# Patient Record
Sex: Male | Born: 1955 | Race: Black or African American | Hispanic: No | Marital: Married | Smoking: Never smoker
Health system: Southern US, Community
[De-identification: ages and names within clinical notes are randomized; demographics above are authoritative.]

## PROBLEM LIST (undated history)

## (undated) DIAGNOSIS — E785 Hyperlipidemia, unspecified: Secondary | ICD-10-CM

## (undated) DIAGNOSIS — I639 Cerebral infarction, unspecified: Secondary | ICD-10-CM

## (undated) DIAGNOSIS — I10 Essential (primary) hypertension: Secondary | ICD-10-CM

---

## 2000-12-11 ENCOUNTER — Emergency Department (HOSPITAL_COMMUNITY): Admission: EM | Admit: 2000-12-11 | Discharge: 2000-12-11 | Payer: Self-pay | Admitting: *Deleted

## 2000-12-11 ENCOUNTER — Encounter: Payer: Self-pay | Admitting: *Deleted

## 2003-03-15 ENCOUNTER — Emergency Department (HOSPITAL_COMMUNITY): Admission: EM | Admit: 2003-03-15 | Discharge: 2003-03-15 | Payer: Self-pay | Admitting: Internal Medicine

## 2005-09-10 ENCOUNTER — Emergency Department (HOSPITAL_COMMUNITY): Admission: EM | Admit: 2005-09-10 | Discharge: 2005-09-10 | Payer: Self-pay | Admitting: Emergency Medicine

## 2008-06-17 ENCOUNTER — Emergency Department (HOSPITAL_COMMUNITY): Admission: EM | Admit: 2008-06-17 | Discharge: 2008-06-17 | Payer: Self-pay | Admitting: Emergency Medicine

## 2008-10-22 ENCOUNTER — Emergency Department (HOSPITAL_COMMUNITY): Admission: EM | Admit: 2008-10-22 | Discharge: 2008-10-22 | Payer: Self-pay | Admitting: Emergency Medicine

## 2008-11-08 ENCOUNTER — Emergency Department (HOSPITAL_COMMUNITY): Admission: EM | Admit: 2008-11-08 | Discharge: 2008-11-08 | Payer: Self-pay | Admitting: Emergency Medicine

## 2010-05-06 ENCOUNTER — Encounter: Payer: Self-pay | Admitting: Internal Medicine

## 2010-07-27 LAB — URINE MICROSCOPIC-ADD ON

## 2010-07-27 LAB — URINALYSIS, ROUTINE W REFLEX MICROSCOPIC
Bilirubin Urine: NEGATIVE
Glucose, UA: NEGATIVE mg/dL
Ketones, ur: NEGATIVE mg/dL
Leukocytes, UA: NEGATIVE
Nitrite: NEGATIVE
Protein, ur: NEGATIVE mg/dL
Specific Gravity, Urine: >1.03 — ABNORMAL HIGH (ref 1.005–1.030)
Urobilinogen, UA: 4 mg/dL — ABNORMAL HIGH (ref 0.0–1.0)
pH: 6 (ref 5.0–8.0)

## 2011-03-23 ENCOUNTER — Emergency Department (HOSPITAL_COMMUNITY)
Admission: EM | Admit: 2011-03-23 | Discharge: 2011-03-23 | Disposition: A | Discharge status: Home / Self Care | Payer: No Typology Code available for payment source | Attending: Emergency Medicine | Admitting: Emergency Medicine

## 2011-03-23 ENCOUNTER — Emergency Department (HOSPITAL_COMMUNITY): Payer: No Typology Code available for payment source

## 2011-03-23 DIAGNOSIS — Z7982 Long term (current) use of aspirin: Secondary | ICD-10-CM | POA: Insufficient documentation

## 2011-03-23 DIAGNOSIS — Y92410 Unspecified street and highway as the place of occurrence of the external cause: Secondary | ICD-10-CM | POA: Insufficient documentation

## 2011-03-23 DIAGNOSIS — S161XXA Strain of muscle, fascia and tendon at neck level, initial encounter: Secondary | ICD-10-CM

## 2011-03-23 DIAGNOSIS — E785 Hyperlipidemia, unspecified: Secondary | ICD-10-CM | POA: Insufficient documentation

## 2011-03-23 DIAGNOSIS — S335XXA Sprain of ligaments of lumbar spine, initial encounter: Secondary | ICD-10-CM | POA: Insufficient documentation

## 2011-03-23 DIAGNOSIS — S39012A Strain of muscle, fascia and tendon of lower back, initial encounter: Secondary | ICD-10-CM

## 2011-03-23 DIAGNOSIS — S139XXA Sprain of joints and ligaments of unspecified parts of neck, initial encounter: Secondary | ICD-10-CM | POA: Insufficient documentation

## 2011-03-23 DIAGNOSIS — Z8673 Personal history of transient ischemic attack (TIA), and cerebral infarction without residual deficits: Secondary | ICD-10-CM | POA: Insufficient documentation

## 2011-03-23 DIAGNOSIS — I10 Essential (primary) hypertension: Secondary | ICD-10-CM | POA: Insufficient documentation

## 2011-03-23 DIAGNOSIS — Z87891 Personal history of nicotine dependence: Secondary | ICD-10-CM | POA: Insufficient documentation

## 2011-03-23 HISTORY — DX: Cerebral infarction, unspecified: I63.9

## 2011-03-23 HISTORY — DX: Hyperlipidemia, unspecified: E78.5

## 2011-03-23 HISTORY — DX: Essential (primary) hypertension: I10

## 2011-03-23 MED ORDER — OXYCODONE-ACETAMINOPHEN 5-325 MG PO TABS
2.0000 | ORAL_TABLET | Freq: Once | ORAL | Status: AC
  Administered 2011-03-23: 2 via ORAL
  Filled 2011-03-23: qty 2

## 2011-03-23 MED ORDER — IBUPROFEN 800 MG PO TABS: 800.0000 mg | ORAL_TABLET | Freq: Three times a day (TID) | ORAL | Status: AC | PRN

## 2011-03-23 MED ORDER — OXYCODONE-ACETAMINOPHEN 5-325 MG PO TABS: 2.0000 | ORAL_TABLET | ORAL | Status: AC | PRN

## 2011-03-23 MED ORDER — DIAZEPAM 5 MG PO TABS: 5.0000 mg | ORAL_TABLET | Freq: Four times a day (QID) | ORAL | Status: AC | PRN

## 2011-03-23 MED ORDER — IBUPROFEN 800 MG PO TABS
800.0000 mg | ORAL_TABLET | Freq: Once | ORAL | Status: AC
  Administered 2011-03-23: 800 mg via ORAL
  Filled 2011-03-23: qty 1

## 2011-03-23 MED ORDER — DIAZEPAM 5 MG PO TABS
5.0000 mg | ORAL_TABLET | Freq: Once | ORAL | Status: AC
  Administered 2011-03-23: 5 mg via ORAL
  Filled 2011-03-23: qty 1
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
## 2011-03-23 NOTE — ED Notes (Signed)
EDP at bedside  
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
## 2014-03-17 ENCOUNTER — Encounter (HOSPITAL_COMMUNITY): Payer: Medicare Other | Attending: Hematology and Oncology

## 2014-03-17 ENCOUNTER — Encounter (HOSPITAL_COMMUNITY): Payer: Self-pay

## 2014-03-17 VITALS — BP 162/84 | HR 93 | Temp 97.9°F | Resp 18 | Wt 141.5 lb

## 2014-03-17 DIAGNOSIS — I10 Essential (primary) hypertension: Secondary | ICD-10-CM | POA: Insufficient documentation

## 2014-03-17 DIAGNOSIS — R209 Unspecified disturbances of skin sensation: Secondary | ICD-10-CM | POA: Diagnosis not present

## 2014-03-17 DIAGNOSIS — Z7982 Long term (current) use of aspirin: Secondary | ICD-10-CM | POA: Insufficient documentation

## 2014-03-17 DIAGNOSIS — E785 Hyperlipidemia, unspecified: Secondary | ICD-10-CM | POA: Insufficient documentation

## 2014-03-17 DIAGNOSIS — I69398 Other sequelae of cerebral infarction: Secondary | ICD-10-CM | POA: Insufficient documentation

## 2014-03-17 DIAGNOSIS — D72829 Elevated white blood cell count, unspecified: Secondary | ICD-10-CM

## 2014-03-17 DIAGNOSIS — I639 Cerebral infarction, unspecified: Secondary | ICD-10-CM

## 2014-03-17 DIAGNOSIS — D75839 Thrombocytosis, unspecified: Secondary | ICD-10-CM | POA: Insufficient documentation

## 2014-03-17 DIAGNOSIS — D473 Essential (hemorrhagic) thrombocythemia: Secondary | ICD-10-CM

## 2014-03-17 DIAGNOSIS — G5600 Carpal tunnel syndrome, unspecified upper limb: Secondary | ICD-10-CM

## 2014-03-17 LAB — COMPREHENSIVE METABOLIC PANEL
ALBUMIN: 3.5 g/dL (ref 3.5–5.2)
ALT: 19 U/L (ref 0–53)
ANION GAP: 11 (ref 5–15)
AST: 21 U/L (ref 0–37)
Alkaline Phosphatase: 63 U/L (ref 39–117)
BUN: 18 mg/dL (ref 6–23)
CALCIUM: 9.2 mg/dL (ref 8.4–10.5)
CO2: 27 mEq/L (ref 19–32)
Chloride: 103 mEq/L (ref 96–112)
Creatinine, Ser: 1.2 mg/dL (ref 0.50–1.35)
GFR calc non Af Amer: 65 mL/min — ABNORMAL LOW (ref >90)
GFR, EST AFRICAN AMERICAN: 75 mL/min — AB (ref >90)
GLUCOSE: 97 mg/dL (ref 70–99)
Potassium: 4 mEq/L (ref 3.7–5.3)
SODIUM: 141 meq/L (ref 137–147)
TOTAL PROTEIN: 7 g/dL (ref 6.0–8.3)
Total Bilirubin: 0.6 mg/dL (ref 0.3–1.2)

## 2014-03-17 LAB — CBC WITH DIFFERENTIAL/PLATELET
BASOS PCT: 1 % (ref 0–1)
Basophils Absolute: 0.1 10*3/uL (ref 0.0–0.1)
EOS ABS: 0.1 10*3/uL (ref 0.0–0.7)
EOS PCT: 2 % (ref 0–5)
HCT: 38.6 % — ABNORMAL LOW (ref 39.0–52.0)
Hemoglobin: 13 g/dL (ref 13.0–17.0)
LYMPHS ABS: 1.5 10*3/uL (ref 0.7–4.0)
Lymphocytes Relative: 28 % (ref 12–46)
MCH: 27 pg (ref 26.0–34.0)
MCHC: 33.7 g/dL (ref 30.0–36.0)
MCV: 80.2 fL (ref 78.0–100.0)
Monocytes Absolute: 0.4 10*3/uL (ref 0.1–1.0)
Monocytes Relative: 8 % (ref 3–12)
NEUTROS PCT: 61 % (ref 43–77)
Neutro Abs: 3.1 10*3/uL (ref 1.7–7.7)
PLATELETS: 314 10*3/uL (ref 150–400)
RBC: 4.81 MIL/uL (ref 4.22–5.81)
RDW: 15.1 % (ref 11.5–15.5)
WBC: 5.1 10*3/uL (ref 4.0–10.5)

## 2014-03-17 LAB — LACTATE DEHYDROGENASE: LDH: 163 U/L (ref 94–250)

## 2014-03-17 LAB — FERRITIN: FERRITIN: 371 ng/mL — AB (ref 22–322)

## 2014-03-17 NOTE — Patient Instructions (Signed)
[facility] Discharge Instructions  RECOMMENDATIONS MADE BY THE CONSULTANT AND ANY TEST RESULTS WILL BE SENT TO YOUR REFERRING PHYSICIAN.  We will see you in 2 weeks for an office visit and repeat CBC. At that time we will review your lab results. Please continue your aspirin.    Thank you for choosing [facility] to provide your oncology and hematology care.  To afford each patient quality time with our providers, please arrive at least 15 minutes before your scheduled appointment time.  With your help, our goal is to use those 15 minutes to complete the necessary work-up to ensure our physicians have the information they need to help with your evaluation and healthcare recommendations.    Effective January 1st, 2014, we ask that you re-schedule your appointment with our physicians should you arrive 10 or more minutes late for your appointment.  We strive to give you quality time with our providers, and arriving late affects you and other patients whose appointments are after yours.    Again, thank you for choosing [facility].  Our hope is that these requests will decrease the amount of time that you wait before being seen by our physicians.       _____________________________________________________________  Should you have questions after your visit to [facility], please contact our office at [phone] between the hours of 8:30 a.m. and 4:30 p.m.  Voicemails left after 4:30 p.m. will not be returned until the following business day.  For prescription refill requests, have your pharmacy contact our office with your prescription refill request.    _______________________________________________________________  We hope that we have given you very good care.  You may receive a patient satisfaction survey in the mail, please complete it and return it as soon as possible.  We value your  feedback!  _______________________________________________________________  Have you asked about our STAR program?  STAR stands for Survivorship Training and Rehabilitation, and this is a nationally recognized cancer care program that focuses on survivorship and rehabilitation.  Cancer and cancer treatments may cause problems, such as, pain, making you feel tired and keeping you from doing the things that you need or want to do. Cancer rehabilitation can help. Our goal is to reduce these troubling effects and help you have the best quality of life possible.  You may receive a survey from a nurse that asks questions about your current state of health.  Based on the survey results, all eligible patients will be referred to the [facility] program for an evaluation so we can better serve you!  A frequently asked questions sheet is available upon request.

## 2014-03-17 NOTE — Progress Notes (Signed)
Jonathan Tapia's reason for visit today are for labs as scheduled per MD orders.  Venipuncture performed with a 23 gauge butterfly needle to R Antecubital.  Jonathan Tapia tolerated venipuncture well and without incident; questions were answered and patient was discharged.

## 2014-03-17 NOTE — Progress Notes (Signed)
[place] A. Barnet Glasgow, M.D.  NEW PATIENT EVALUATION   Name: Jonathan Tapia Date: 03/18/2014 MRN: [id number] DOB: 02/15/1956  PCP: Jacqulyn Bath, MD   REFERRING PHYSICIAN: No ref. provider found  REASON FOR REFERRAL: Thrombocytosis     HISTORY OF PRESENT ILLNESS:Jonathan Tapia is a 58 y.o. male who is referred by Dr. Lance Coon for evaluation of thrombocytosis. 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 He still has some residual paresthesias involving the right upper extremity and right lower extremity. Appetite has been good with no easy satiety, fever but with an episode of night sweats about 2 weeks ago that seemed to be persistent. He denies any cough, wheezing, sore throat, diarrhea, constipation, melena, hematochezia, hematuria, urinary hesitancy, lower extremity swelling or redness, joint pain, skin rash, pruritus, headache, or seizures.   PAST MEDICAL HISTORY:  has a past medical history of Hypertension; Hyperlipidemia; and Stroke.     PAST SURGICAL HISTORY:History reviewed. No pertinent past surgical history.   CURRENT MEDICATIONS: has a current medication list which includes the following prescription(s): aspirin ec, carvedilol, lisinopril-hydrochlorothiazide, pravastatin, and tamsulosin.   ALLERGIES: Review of patient's allergies indicates no known allergies.   SOCIAL HISTORY:  reports that he has never smoked. He does not have any smokeless tobacco history on file. He reports that he drinks alcohol. He reports that he does not use illicit drugs.   FAMILY HISTORY: family history includes Cancer in his mother; Hypertension in his mother and sister.    REVIEW OF SYSTEMS:  Other than that discussed above is noncontributory.    PHYSICAL EXAM:  weight is 141 lb 8 oz (64.184 kg). His oral temperature is 97.9 F  (36.6 C). His blood pressure is 162/84 and his pulse is 93. His respiration is 18 and oxygen saturation is 93%.    GENERAL:alert, no distress and comfortable SKIN: skin color, texture, turgor are normal, no rashes or significant lesions EYES: normal, Conjunctiva are pink and non-injected, sclera clear OROPHARYNX:no exudate, no erythema and lips, buccal mucosa, and tongue normal  NECK: supple, thyroid normal size, non-tender, without nodularity CHEST: Increased AP diameter with no breast masses. LYMPH:  no palpable lymphadenopathy in the cervical, axillary or inguinal LUNGS: clear to auscultation and percussion with normal breathing effort HEART: regular rate & rhythm and no murmurs ABDOMEN:abdomen soft, non-tender and normal bowel sounds MUSCULOSKELETALl:no cyanosis of digits, no clubbing or edema  NEURO: alert & oriented x 3 with fluent speech, no focal motor/sensory deficits    LABORATORY DATA:  Lab Results  Component Value Date   WBC 5.1 03/17/2014   HGB 13.0 03/17/2014   HCT 38.6* 03/17/2014   MCV 80.2 03/17/2014   PLT 314 03/17/2014    02/22/2014:  WBC 12.0, hemoglobin 13.2, platelets 506,000, MCV 84.5.                    Altace 64, BUN 16, creatinine 1.35, TSH 1.71     @RADIOGRAPHY : No results found.  PATHOLOGY: None.   IMPRESSION:  #1. Thrombocytosis. #2. 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 #3. Essential hypertension, controlled. #4. Hyperlipidemia, on treatment. #5. History of carpal tunnel syndrome.   PLAN:  #1. In the presence of his wife and a friend, thrombocytosis was discussed as either a secondary or primary phenomenon. In an effort to determine etiology, additional  lab tests were done today. #2. Continue daily aspirin. #3. Follow-up in 2 weeks with CBC.  I appreciate the opportunity of sharing in his care.   Doroteo Bradford, MD 03/18/2014 7:47 AM   DISCLAIMER:  This note was dictated with voice recognition  softwre.  Similar sounding words can inadvertently be transcribed inaccurately and may not be corrected upon review.

## 2014-03-20 LAB — BCR/ABL GENE REARRANGEMENT QNT, PCR
BCR ABL1 / ABL1 IS: 0 %
BCR ABL1/ABL1: 0 %

## 2014-03-21 LAB — P190 BCR-ABL 1: P190 BCR ABL1: NOT DETECTED

## 2014-03-21 LAB — P210 BCR-ABL 1: P210 BCR ABL1: NOT DETECTED

## 2014-03-23 LAB — JAK2 GENOTYPR

## 2014-03-31 ENCOUNTER — Encounter (HOSPITAL_BASED_OUTPATIENT_CLINIC_OR_DEPARTMENT_OTHER): Payer: Medicare Other

## 2014-03-31 ENCOUNTER — Encounter (HOSPITAL_COMMUNITY): Payer: Self-pay

## 2014-03-31 VITALS — BP 141/75 | HR 70 | Temp 98.6°F | Resp 18 | Wt 142.0 lb

## 2014-03-31 DIAGNOSIS — D473 Essential (hemorrhagic) thrombocythemia: Secondary | ICD-10-CM

## 2014-03-31 DIAGNOSIS — I10 Essential (primary) hypertension: Secondary | ICD-10-CM

## 2014-03-31 DIAGNOSIS — D75839 Thrombocytosis, unspecified: Secondary | ICD-10-CM

## 2014-03-31 DIAGNOSIS — I639 Cerebral infarction, unspecified: Secondary | ICD-10-CM

## 2014-03-31 LAB — CBC WITH DIFFERENTIAL/PLATELET
Basophils Absolute: 0.1 10*3/uL (ref 0.0–0.1)
Basophils Relative: 1 % (ref 0–1)
EOS ABS: 0.2 10*3/uL (ref 0.0–0.7)
EOS PCT: 3 % (ref 0–5)
HCT: 40.1 % (ref 39.0–52.0)
HEMOGLOBIN: 13.1 g/dL (ref 13.0–17.0)
LYMPHS ABS: 2 10*3/uL (ref 0.7–4.0)
Lymphocytes Relative: 28 % (ref 12–46)
MCH: 26.7 pg (ref 26.0–34.0)
MCHC: 32.7 g/dL (ref 30.0–36.0)
MCV: 81.7 fL (ref 78.0–100.0)
MONO ABS: 0.8 10*3/uL (ref 0.1–1.0)
MONOS PCT: 11 % (ref 3–12)
Neutro Abs: 3.9 10*3/uL (ref 1.7–7.7)
Neutrophils Relative %: 57 % (ref 43–77)
Platelets: 300 10*3/uL (ref 150–400)
RBC: 4.91 MIL/uL (ref 4.22–5.81)
RDW: 15.1 % (ref 11.5–15.5)
WBC: 6.9 10*3/uL (ref 4.0–10.5)

## 2014-03-31 NOTE — Patient Instructions (Signed)
..  [facility] Discharge Instructions  RECOMMENDATIONS MADE BY THE CONSULTANT AND ANY TEST RESULTS WILL BE SENT TO YOUR REFERRING PHYSICIAN.  EXAM FINDINGS BY THE PHYSICIAN TODAY AND SIGNS OR SYMPTOMS TO REPORT TO CLINIC OR PRIMARY PHYSICIAN: Exam and findings as discussed by Dr. Barnet Glasgow. You had reactive thrombocytosis INSTRUCTIONS/FOLLOW-UP: No follow up needed  Thank you for choosing [facility] to provide your oncology and hematology care.  To afford each patient quality time with our providers, please arrive at least 15 minutes before your scheduled appointment time.  With your help, our goal is to use those 15 minutes to complete the necessary work-up to ensure our physicians have the information they need to help with your evaluation and healthcare recommendations.    Effective January 1st, 2014, we ask that you re-schedule your appointment with our physicians should you arrive 10 or more minutes late for your appointment.  We strive to give you quality time with our providers, and arriving late affects you and other patients whose appointments are after yours.    Again, thank you for choosing [facility].  Our hope is that these requests will decrease the amount of time that you wait before being seen by our physicians.       _____________________________________________________________  Should you have questions after your visit to [facility], please contact our office at [phone] between the hours of 8:30 a.m. and 4:30 p.m.  Voicemails left after 4:30 p.m. will not be returned until the following business day.  For prescription refill requests, have your pharmacy contact our office with your prescription refill request.    _______________________________________________________________  We hope that we have given you very good care.  You may receive a patient satisfaction survey in the mail, please complete it and  return it as soon as possible.  We value your feedback!  _______________________________________________________________  Have you asked about our STAR program?  STAR stands for Survivorship Training and Rehabilitation, and this is a nationally recognized cancer care program that focuses on survivorship and rehabilitation.  Cancer and cancer treatments may cause problems, such as, pain, making you feel tired and keeping you from doing the things that you need or want to do. Cancer rehabilitation can help. Our goal is to reduce these troubling effects and help you have the best quality of life possible.  You may receive a survey from a nurse that asks questions about your current state of health.  Based on the survey results, all eligible patients will be referred to the [facility] program for an evaluation so we can better serve you!  A frequently asked questions sheet is available upon request.

## 2014-03-31 NOTE — Progress Notes (Signed)
[place], MD [address] [place] [address]  DIAGNOSIS: Thrombocytosis  Stroke  Essential hypertension  Chief Complaint  Patient presents with  . Thrombocytosis    CURRENT THERAPY: Workup completed for thrombocytosis with original platelet count of 506,000 on 02/22/2014 from an outside lab.  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 On his first visit to this clinic on 03/17/2014 platelet count had dropped to 314,000. He has not developed any worsening right-sided symptoms. Appetite is good with no headache, fever, night sweats, easy satiety, lower extremity swelling or redness, cough, wheezing, diarrhea, constipation, dysuria, hematuria, urinary hesitancy, skin rash, joint pain, headache, or seizures.  MEDICAL HISTORY: Past Medical History  Diagnosis Date  . Hypertension   . Hyperlipidemia   . Stroke     INTERIM HISTORY: has Thrombocytosis; Stroke; and Hypertension on his problem list.    ALLERGIES:  has No Known Allergies.  MEDICATIONS: has a current medication list which includes the following prescription(s): aspirin ec, carvedilol, lisinopril-hydrochlorothiazide, pravastatin, and tamsulosin.  SURGICAL HISTORY: History reviewed. No pertinent past surgical history.  FAMILY HISTORY: family history includes Cancer in his mother; Hypertension in his mother and sister.  SOCIAL HISTORY:  reports that he has never smoked. He does not have any smokeless tobacco history on file. He reports that he drinks alcohol. He reports that he does not use illicit drugs.  REVIEW OF SYSTEMS:  Other than that discussed above is noncontributory.  PHYSICAL EXAMINATION: ECOG PERFORMANCE STATUS: 1 - Symptomatic but completely ambulatory  Blood pressure 141/75, pulse 70, temperature 98.6 F (37 C), temperature source Oral,  resp. rate 18, weight 142 lb (64.411 kg), SpO2 100 %.  GENERAL:alert, no distress and comfortable SKIN: skin color, texture, turgor are normal, no rashes or significant lesions EYES: PERLA; Conjunctiva are pink and non-injected, sclera clear SINUSES: No redness or tenderness over maxillary or ethmoid sinuses OROPHARYNX:no exudate, no erythema on lips, buccal mucosa, or tongue. NECK: supple, thyroid normal size, non-tender, without nodularity. No masses CHEST: Increased AP diameter with no gynecomastia. LYMPH:  no palpable lymphadenopathy in the cervical, axillary or inguinal LUNGS: clear to auscultation and percussion with normal breathing effort HEART: regular rate & rhythm and no murmurs. ABDOMEN:abdomen soft, non-tender and normal bowel sounds. Liver and spleen not palpable. MUSCULOSKELETAL:no cyanosis of digits and no clubbing. Range of motion normal.  NEURO: alert & oriented x 3 with fluent speech, no focal motor/sensory deficits   LABORATORY DATA: Lab on 03/31/2014  Component Date Value Ref Range Status  . WBC 03/31/2014 6.9  4.0 - 10.5 K/uL Final  . RBC 03/31/2014 4.91  4.22 - 5.81 MIL/uL Final  . Hemoglobin 03/31/2014 13.1  13.0 - 17.0 g/dL Final  . HCT 03/31/2014 40.1  39.0 - 52.0 % Final  . MCV 03/31/2014 81.7  78.0 - 100.0 fL Final  . MCH 03/31/2014 26.7  26.0 - 34.0 pg Final  . MCHC 03/31/2014 32.7  30.0 - 36.0 g/dL Final  . RDW 03/31/2014 15.1  11.5 - 15.5 % Final  . Platelets 03/31/2014 300  150 - 400 K/uL Final  . Neutrophils Relative % 03/31/2014 57  43 - 77 % Final  . Neutro Abs 03/31/2014 3.9  1.7 - 7.7 K/uL Final  . Lymphocytes Relative 03/31/2014 28  12 - 46 % Final  . Lymphs Abs 03/31/2014 2.0  0.7 -  4.0 K/uL Final  . Monocytes Relative 03/31/2014 11  3 - 12 % Final  . Monocytes Absolute 03/31/2014 0.8  0.1 - 1.0 K/uL Final  . Eosinophils Relative 03/31/2014 3  0 - 5 % Final  . Eosinophils Absolute 03/31/2014 0.2  0.0 - 0.7 K/uL Final  . Basophils Relative  03/31/2014 1  0 - 1 % Final  . Basophils Absolute 03/31/2014 0.1  0.0 - 0.1 K/uL Final  Office Visit on 03/17/2014  Component Date Value Ref Range Status  . WBC 03/17/2014 5.1  4.0 - 10.5 K/uL Final  . RBC 03/17/2014 4.81  4.22 - 5.81 MIL/uL Final  . Hemoglobin 03/17/2014 13.0  13.0 - 17.0 g/dL Final  . HCT 03/17/2014 38.6* 39.0 - 52.0 % Final  . MCV 03/17/2014 80.2  78.0 - 100.0 fL Final  . MCH 03/17/2014 27.0  26.0 - 34.0 pg Final  . MCHC 03/17/2014 33.7  30.0 - 36.0 g/dL Final  . RDW 03/17/2014 15.1  11.5 - 15.5 % Final  . Platelets 03/17/2014 314  150 - 400 K/uL Final  . Neutrophils Relative % 03/17/2014 61  43 - 77 % Final  . Neutro Abs 03/17/2014 3.1  1.7 - 7.7 K/uL Final  . Lymphocytes Relative 03/17/2014 28  12 - 46 % Final  . Lymphs Abs 03/17/2014 1.5  0.7 - 4.0 K/uL Final  . Monocytes Relative 03/17/2014 8  3 - 12 % Final  . Monocytes Absolute 03/17/2014 0.4  0.1 - 1.0 K/uL Final  . Eosinophils Relative 03/17/2014 2  0 - 5 % Final  . Eosinophils Absolute 03/17/2014 0.1  0.0 - 0.7 K/uL Final  . Basophils Relative 03/17/2014 1  0 - 1 % Final  . Basophils Absolute 03/17/2014 0.1  0.0 - 0.1 K/uL Final  . Sodium 03/17/2014 141  137 - 147 mEq/L Final  . Potassium 03/17/2014 4.0  3.7 - 5.3 mEq/L Final  . Chloride 03/17/2014 103  96 - 112 mEq/L Final  . CO2 03/17/2014 27  19 - 32 mEq/L Final  . Glucose, Bld 03/17/2014 97  70 - 99 mg/dL Final  . BUN 03/17/2014 18  6 - 23 mg/dL Final  . Creatinine, Ser 03/17/2014 1.20  0.50 - 1.35 mg/dL Final  . Calcium 03/17/2014 9.2  8.4 - 10.5 mg/dL Final  . Total Protein 03/17/2014 7.0  6.0 - 8.3 g/dL Final  . Albumin 03/17/2014 3.5  3.5 - 5.2 g/dL Final  . AST 03/17/2014 21  0 - 37 U/L Final  . ALT 03/17/2014 19  0 - 53 U/L Final  . Alkaline Phosphatase 03/17/2014 63  39 - 117 U/L Final  . Total Bilirubin 03/17/2014 0.6  0.3 - 1.2 mg/dL Final  . GFR calc non Af Amer 03/17/2014 65* >90 mL/min Final  . GFR calc Af Amer 03/17/2014 75* >90  mL/min Final   Comment: (NOTE) The eGFR has been calculated using the CKD EPI equation. This calculation has not been validated in all clinical situations. eGFR's persistently <90 mL/min signify possible Chronic Kidney Disease.   . Anion gap 03/17/2014 11  5 - 15 Final  . LDH 03/17/2014 163  94 - 250 U/L Final  . Ferritin 03/17/2014 371* 22 - 322 ng/mL Final   Performed at [facility]  . BCR ABL1 / ABL1 03/17/2014 0.000   Final  . BCR ABL1 / ABL1 IS 03/17/2014 0.000   Final  . Interpretation - BCRQ 03/17/2014 REPORT   Final   Comment: (NOTE)  The P190 and P210 BCR-ABL1 fusion transcripts are NOT detected. Reverse transcription real-time PCR is performed for the P190 and P210 BCR-ABL1 transcripts associated with the t(9;22) chromosomal translocation. For P190, results are expressed as a percent ratio of BCR-ABL1 to the ABL1 transcript, and further adjusted to the international scale (IS) for P210. Assay sensitivity is dependent on RNA quality and sample cellularity but is usually at least 4-logs below BCR-ABL1 baseline transcript levels. Reference range is 0.000% BCR-ABL1/ABL1. This test was developed and its analytical performance characteristics have been determined by [employer] [facility], [place], [place]. It has not been cleared or approved by the FDA. This assay has been validated pursuant to the CLIA regulations and is used for clinical purposes.                             Yvetta Coder, M.D., Ph.D Medical Director, Molecular Oncology Performed at [facility]   . JAK2 GenotypR 03/17/2014 SEE SEPARATE REPORT   Final  . P190 BCR ABL1 03/17/2014 Not Detected   Final   Performed at [facility]  . P210 BCR ABL1 03/17/2014 Not Detected   Final   Performed at [facility]:  JAK-2 mutation and BCR-ABL both negative.  Urinalysis    Component Value Date/Time   COLORURINE YELLOW 06/17/2008 0154   APPEARANCEUR CLEAR  06/17/2008 0154   LABSPEC >1.030* 06/17/2008 0154   PHURINE 6.0 06/17/2008 0154   GLUCOSEU NEGATIVE 06/17/2008 0154   HGBUR TRACE* 06/17/2008 0154   BILIRUBINUR NEGATIVE 06/17/2008 0154   KETONESUR NEGATIVE 06/17/2008 0154   PROTEINUR NEGATIVE 06/17/2008 0154   UROBILINOGEN 4.0* 06/17/2008 0154   NITRITE NEGATIVE 06/17/2008 0154   LEUKOCYTESUR NEGATIVE 06/17/2008 0154    RADIOGRAPHIC STUDIES: No results found.  ASSESSMENT:  #1. Reactive thrombocytosis, resolved. #2. 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 #3. Essential hypertension, controlled. #4. Hyperlipidemia, on treatment. #5. History of carpal tunnel syndrome.   PLAN:  #1. Continue daily aspirin as stroke prophylaxis. #2. No further appointments in this clinic.   All questions were answered. The patient knows to call the clinic with any problems, questions or concerns. We can certainly see the patient much sooner if necessary.   I spent 25 minutes counseling the patient face to face. The total time spent in the appointment was 30 minutes.    Doroteo Bradford, MD 03/31/2014 7:57 PM  DISCLAIMER:  This note was dictated with voice recognition software.  Similar sounding words can inadvertently be transcribed inaccurately and may not be corrected upon review.

## 2014-03-31 NOTE — Progress Notes (Signed)
Jonathan Tapia presented for labwork. Labs per MD order drawn via Peripheral Line 23 gauge needle inserted in right upper forearm  Good blood return present. Procedure without incident.  Needle removed intact. Patient tolerated procedure well.

## 2015-08-02 ENCOUNTER — Emergency Department (HOSPITAL_COMMUNITY)
Admission: EM | Admit: 2015-08-02 | Discharge: 2015-08-02 | Disposition: A | Discharge status: Home / Self Care | Payer: No Typology Code available for payment source | Attending: Emergency Medicine | Admitting: Emergency Medicine

## 2015-08-02 ENCOUNTER — Encounter (HOSPITAL_COMMUNITY): Payer: Self-pay | Admitting: *Deleted

## 2015-08-02 DIAGNOSIS — I10 Essential (primary) hypertension: Secondary | ICD-10-CM | POA: Diagnosis not present

## 2015-08-02 DIAGNOSIS — Z7982 Long term (current) use of aspirin: Secondary | ICD-10-CM | POA: Diagnosis not present

## 2015-08-02 DIAGNOSIS — Z79899 Other long term (current) drug therapy: Secondary | ICD-10-CM | POA: Insufficient documentation

## 2015-08-02 DIAGNOSIS — S39012A Strain of muscle, fascia and tendon of lower back, initial encounter: Secondary | ICD-10-CM | POA: Insufficient documentation

## 2015-08-02 DIAGNOSIS — E785 Hyperlipidemia, unspecified: Secondary | ICD-10-CM | POA: Diagnosis not present

## 2015-08-02 DIAGNOSIS — Y939 Activity, unspecified: Secondary | ICD-10-CM | POA: Diagnosis not present

## 2015-08-02 DIAGNOSIS — Y92410 Unspecified street and highway as the place of occurrence of the external cause: Secondary | ICD-10-CM | POA: Diagnosis not present

## 2015-08-02 DIAGNOSIS — Y999 Unspecified external cause status: Secondary | ICD-10-CM | POA: Diagnosis not present

## 2015-08-02 DIAGNOSIS — M25551 Pain in right hip: Secondary | ICD-10-CM | POA: Diagnosis present

## 2015-08-02 DIAGNOSIS — I639 Cerebral infarction, unspecified: Secondary | ICD-10-CM | POA: Insufficient documentation

## 2015-08-02 MED ORDER — CYCLOBENZAPRINE HCL 10 MG PO TABS: 10.0000 mg | ORAL_TABLET | Freq: Two times a day (BID) | ORAL | Status: DC | PRN

## 2015-08-02 MED ORDER — NAPROXEN 375 MG PO TABS: 375.0000 mg | ORAL_TABLET | Freq: Two times a day (BID) | ORAL | Status: DC

## 2015-08-02 NOTE — Discharge Instructions (Signed)
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 these conditions: -asthma -cigarette smoker -drink more than 3 alcohol containing drinks a day -heart disease or circulation problems such as heart failure or leg edema (fluid retention) -high blood pressure -kidney disease -liver disease -stomach bleeding or ulcers -an unusual or allergic reaction to naproxen, aspirin, other NSAIDs, other medicines, foods, dyes, or preservatives -pregnant or trying to get pregnant -breast-feeding How should I use this medicine? Take this medicine by mouth with a glass of water. Follow the directions on the prescription label. Take it with food  if your stomach gets upset. Try to not lie down for at least 10 minutes after you take it. Take your medicine at regular intervals. Do not take your medicine more often than directed. Long-term, continuous use may increase the risk of heart attack or stroke. A special MedGuide will be given to you by the pharmacist with each prescription and refill. Be sure to read this information carefully each time. Talk to your  pediatrician regarding the use of this medicine in children. Special care may be needed. Overdosage: If you think you have taken too much of this medicine contact a poison control center or emergency room at once. NOTE: This medicine is only for you. Do not share this medicine with others. What if I miss a dose? If you miss a dose, take it as soon as you can. If it is almost time for your next dose, take only that dose. Do not take double or extra doses. What may interact with this medicine? -alcohol -aspirin -cidofovir -diuretics -lithium -methotrexate -other drugs for inflammation like ketorolac or prednisone -pemetrexed -probenecid -warfarin This list may not describe all possible interactions. Give your health care provider a list of all the medicines, herbs, non-prescription drugs, or dietary supplements you use. Also tell them if you smoke, drink alcohol, or use illegal drugs. Some items may interact with your medicine. What should I watch for while using this medicine? Tell your doctor or health care professional if your pain does not get better. Talk to your doctor before taking another medicine for pain. Do not treat yourself. This medicine does not prevent heart attack or stroke. In fact, this medicine may increase the chance of a heart attack or stroke. The chance may increase with longer use of this medicine and in people who have heart disease. If you take aspirin to prevent heart attack or stroke, talk with your doctor or health care professional. Do not take other medicines that contain aspirin, ibuprofen, or naproxen with this medicine. Side effects such as stomach upset, nausea, or ulcers may be more likely to occur. Many medicines available without a prescription should not be taken with this medicine. This medicine can cause ulcers and bleeding in the stomach and intestines at any time during treatment. Do not smoke cigarettes or drink alcohol. These increase irritation to  your stomach and can make it more susceptible to damage from this medicine. Ulcers and bleeding can happen without warning symptoms and can cause death. You may get drowsy or dizzy. Do not drive, use machinery, or do anything that needs mental alertness until you know how this medicine affects you. Do not stand or sit up quickly, especially if you are an older patient. This reduces the risk of dizzy or fainting spells. This medicine can cause you to bleed more easily. Try to avoid damage to your teeth and gums when you brush or floss your teeth. What side effects may I notice from receiving this medicine? Side effects that you should report to your doctor or health care professional as soon as possible: -black or bloody stools, blood in the urine or vomit -blurred vision -chest pain -difficulty breathing or wheezing -nausea or vomiting -severe stomach pain -skin rash, skin redness, blistering or peeling skin, hives, or itching -slurred speech or weakness on one side of the body -swelling of eyelids, throat, lips -unexplained weight gain or swelling -unusually weak or tired -yellowing of eyes or skin Side effects that usually do not require medical attention (report to your  doctor or health care professional if they continue or are bothersome): -constipation -headache -heartburn This list may not describe all possible side effects. Call your doctor for medical advice about side effects. You may report side effects to FDA at 1-800-FDA-1088. Where should I keep my medicine? Keep out of the reach of children. Store at room temperature between 15 and 30 degrees C (59 and 86 degrees F). Keep container tightly closed. Throw away any unused medicine after the expiration date. NOTE: This sheet is a summary. It may not cover all possible information. If you have questions about this medicine, talk to your doctor, pharmacist, or health care provider.    2016, Elsevier/Gold Standard. (2009-04-04  20:10:16) Cyclobenzaprine tablets What is this medicine? CYCLOBENZAPRINE (sye kloe BEN za preen) is a muscle relaxer. It is used to treat muscle pain, spasms, and stiffness. This medicine may be used for other purposes; ask your health care provider or pharmacist if you have questions. What should I tell my health care provider before I take this medicine? They need to know if you have any of these conditions: -heart disease, irregular heartbeat, or previous heart attack -liver disease -thyroid problem -an unusual or allergic reaction to cyclobenzaprine, tricyclic antidepressants, lactose, other medicines, foods, dyes, or preservatives -pregnant or trying to get pregnant -breast-feeding How should I use this medicine? Take this medicine by mouth with a glass of water. Follow the directions on the prescription label. If this medicine upsets your stomach, take it with food or milk. Take your medicine at regular intervals. Do not take it more often than directed. Talk to your pediatrician regarding the use of this medicine in children. Special care may be needed. Overdosage: If you think you have taken too much of this medicine contact a poison control center or emergency room at once. NOTE: This medicine is only for you. Do not share this medicine with others. What if I miss a dose? If you miss a dose, take it as soon as you can. If it is almost time for your next dose, take only that dose. Do not take double or extra doses. What may interact with this medicine? Do not take this medicine with any of the following medications: -certain medicines for fungal infections like fluconazole, itraconazole, ketoconazole, posaconazole, voriconazole -cisapride -dofetilide -dronedarone -droperidol -flecainide -grepafloxacin -halofantrine -levomethadyl -MAOIs like Carbex, Eldepryl, Marplan, Nardil, and Parnate -nilotinib -pimozide -probucol -sertindole -thioridazine -ziprasidone This medicine  may also interact with the following medications: -abarelix -alcohol -certain medicines for cancer -certain medicines for depression, anxiety, or psychotic disturbances -certain medicines for infection like alfuzosin, chloroquine, clarithromycin, levofloxacin, mefloquine, pentamidine, troleandomycin -certain medicines for an irregular heart beat -certain medicines used for sleep or numbness during surgery or procedure -contrast dyes -dolasetron -guanethidine -methadone -octreotide -ondansetron -other medicines that prolong the QT interval (cause an abnormal heart rhythm) -palonosetron -phenothiazines like chlorpromazine, mesoridazine, prochlorperazine, thioridazine -tramadol -vardenafil This list may not describe all possible interactions. Give your health care provider a list of all the medicines, herbs, non-prescription drugs, or dietary supplements you use. Also tell them if you smoke, drink alcohol, or use illegal drugs. Some items may interact with your medicine. What should I watch for while using this medicine? Check with your doctor or health care professional if your condition does not improve within 1 to 3 weeks. You may get drowsy or dizzy when you first start taking the medicine or change doses. Do not drive, use machinery, or do anything that may be dangerous until you  know how the medicine affects you. Stand or sit up slowly. Your mouth may get dry. Drinking water, chewing sugarless gum, or sucking on hard candy may help. What side effects may I notice from receiving this medicine? Side effects that you should report to your doctor or health care professional as soon as possible: -allergic reactions like skin rash, itching or hives, swelling of the face, lips, or tongue -chest pain -fast heartbeat -hallucinations -seizures -vomiting Side effects that usually do not require medical attention (report to your doctor or health care professional if they continue or are  bothersome): -headache This list may not describe all possible side effects. Call your doctor for medical advice about side effects. You may report side effects to FDA at 1-800-FDA-1088. Where should I keep my medicine? Keep out of the reach of children. Store at room temperature between 15 and 30 degrees C (59 and 86 degrees F). Keep container tightly closed. Throw away any unused medicine after the expiration date. NOTE: This sheet is a summary. It may not cover all possible information. If you have questions about this medicine, talk to your doctor, pharmacist, or health care provider.    2016, Elsevier/Gold Standard. (2012-10-28 12:48:19)
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
## 2018-06-28 ENCOUNTER — Emergency Department (HOSPITAL_COMMUNITY): Payer: Medicare HMO

## 2018-06-28 ENCOUNTER — Encounter (HOSPITAL_COMMUNITY): Payer: Self-pay | Admitting: Emergency Medicine

## 2018-06-28 ENCOUNTER — Other Ambulatory Visit: Payer: Self-pay

## 2018-06-28 ENCOUNTER — Emergency Department (HOSPITAL_COMMUNITY)
Admission: EM | Admit: 2018-06-28 | Discharge: 2018-06-28 | Disposition: A | Discharge status: Home / Self Care | Payer: Medicare HMO | Attending: Emergency Medicine | Admitting: Emergency Medicine

## 2018-06-28 DIAGNOSIS — Y999 Unspecified external cause status: Secondary | ICD-10-CM | POA: Insufficient documentation

## 2018-06-28 DIAGNOSIS — Y929 Unspecified place or not applicable: Secondary | ICD-10-CM | POA: Diagnosis not present

## 2018-06-28 DIAGNOSIS — I10 Essential (primary) hypertension: Secondary | ICD-10-CM | POA: Diagnosis not present

## 2018-06-28 DIAGNOSIS — S838X2A Sprain of other specified parts of left knee, initial encounter: Secondary | ICD-10-CM

## 2018-06-28 DIAGNOSIS — W1842XA Slipping, tripping and stumbling without falling due to stepping into hole or opening, initial encounter: Secondary | ICD-10-CM | POA: Diagnosis not present

## 2018-06-28 DIAGNOSIS — Z7982 Long term (current) use of aspirin: Secondary | ICD-10-CM | POA: Insufficient documentation

## 2018-06-28 DIAGNOSIS — S838X9A Sprain of other specified parts of unspecified knee, initial encounter: Secondary | ICD-10-CM | POA: Diagnosis not present

## 2018-06-28 DIAGNOSIS — Z79899 Other long term (current) drug therapy: Secondary | ICD-10-CM | POA: Insufficient documentation

## 2018-06-28 DIAGNOSIS — S8992XA Unspecified injury of left lower leg, initial encounter: Secondary | ICD-10-CM | POA: Diagnosis present

## 2018-06-28 DIAGNOSIS — Y9301 Activity, walking, marching and hiking: Secondary | ICD-10-CM | POA: Insufficient documentation

## 2018-06-28 MED ORDER — DICLOFENAC SODIUM 50 MG PO TBEC: 50.0000 mg | DELAYED_RELEASE_TABLET | Freq: Two times a day (BID) | ORAL | 0 refills | Status: DC
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
## 2018-07-04 ENCOUNTER — Encounter: Payer: Self-pay | Admitting: Orthopedic Surgery

## 2018-07-04 ENCOUNTER — Ambulatory Visit: Payer: Self-pay | Admitting: Orthopedic Surgery

## 2020-10-16 IMAGING — DX LEFT KNEE - COMPLETE 4+ VIEW
4 series · 4 of 4 positions shown · non-contrast
Comparison: None.

CLINICAL DATA: Patient status post fall.  Knee pain.

EXAM:
LEFT KNEE - COMPLETE 4+ VIEW

[knee ap (1 of 3)]
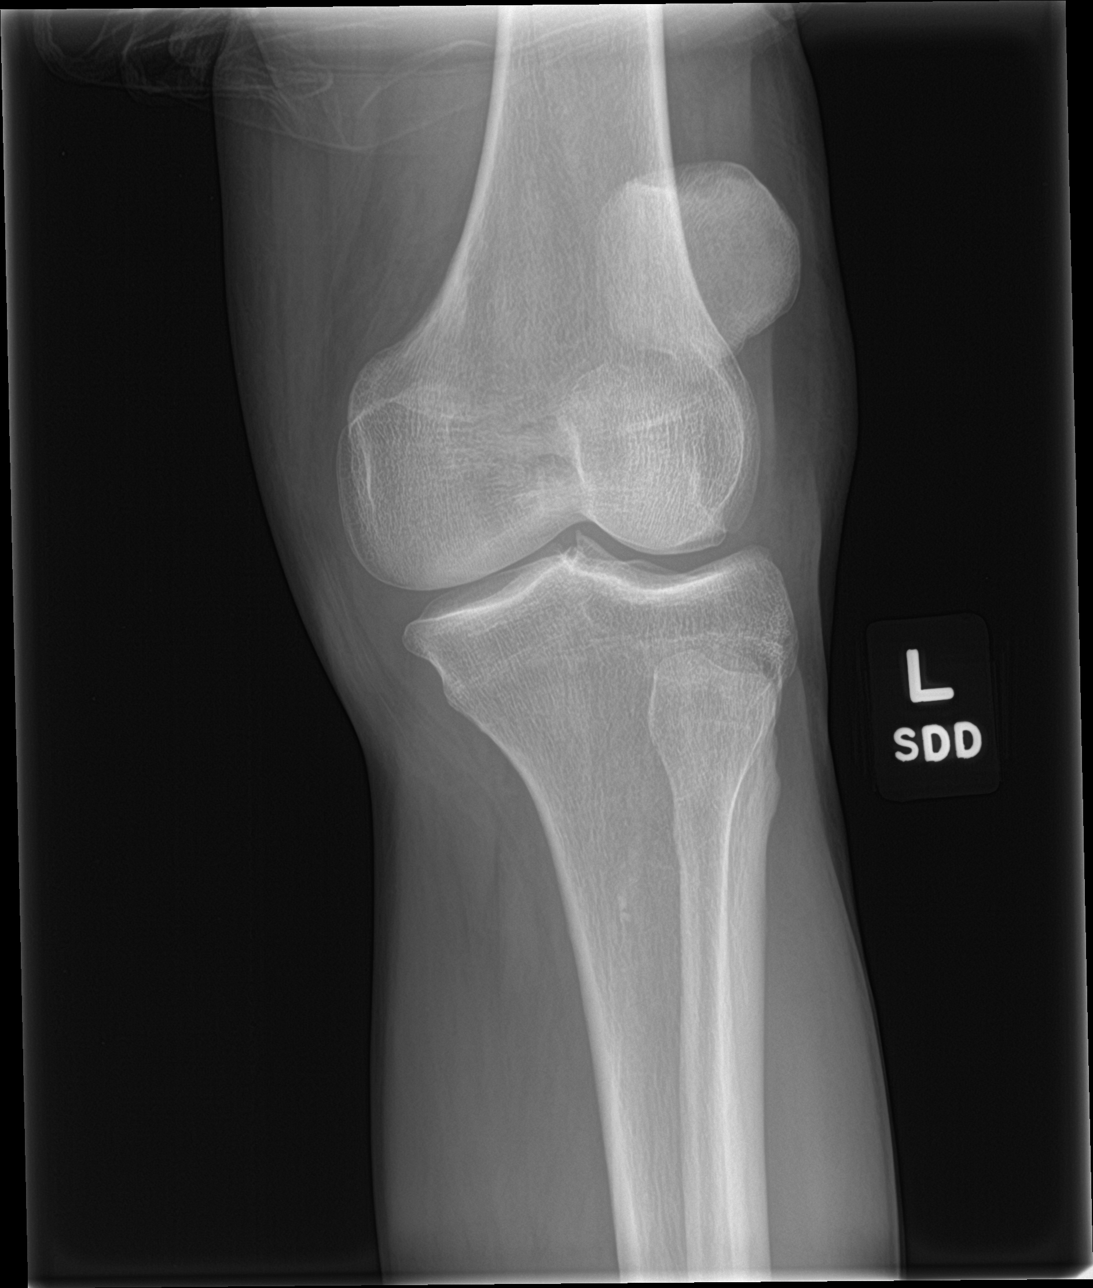

[knee ap (2 of 3)]
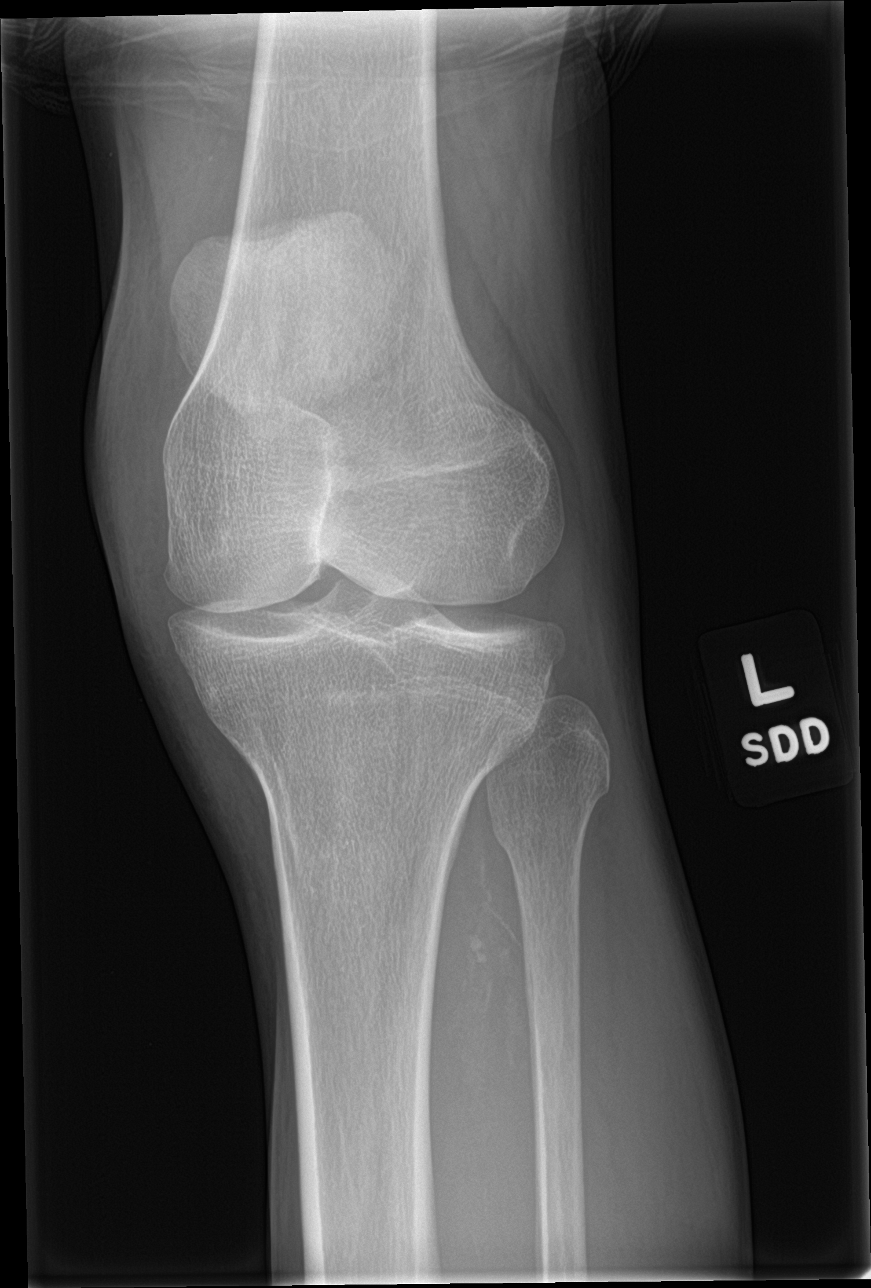

[knee ap (3 of 3)]
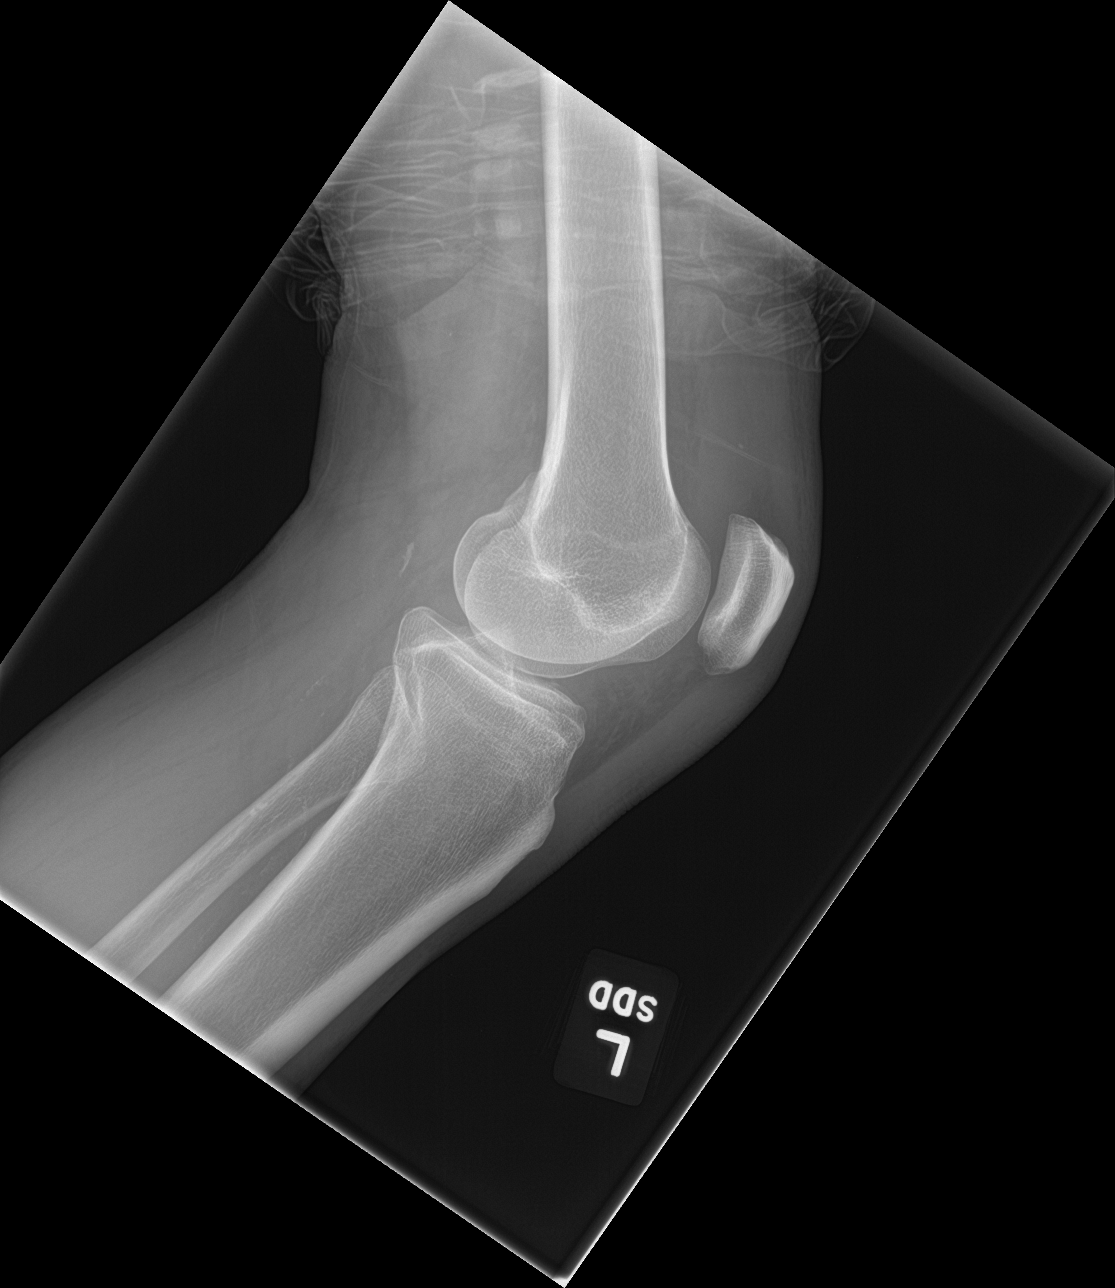

[knee lat]
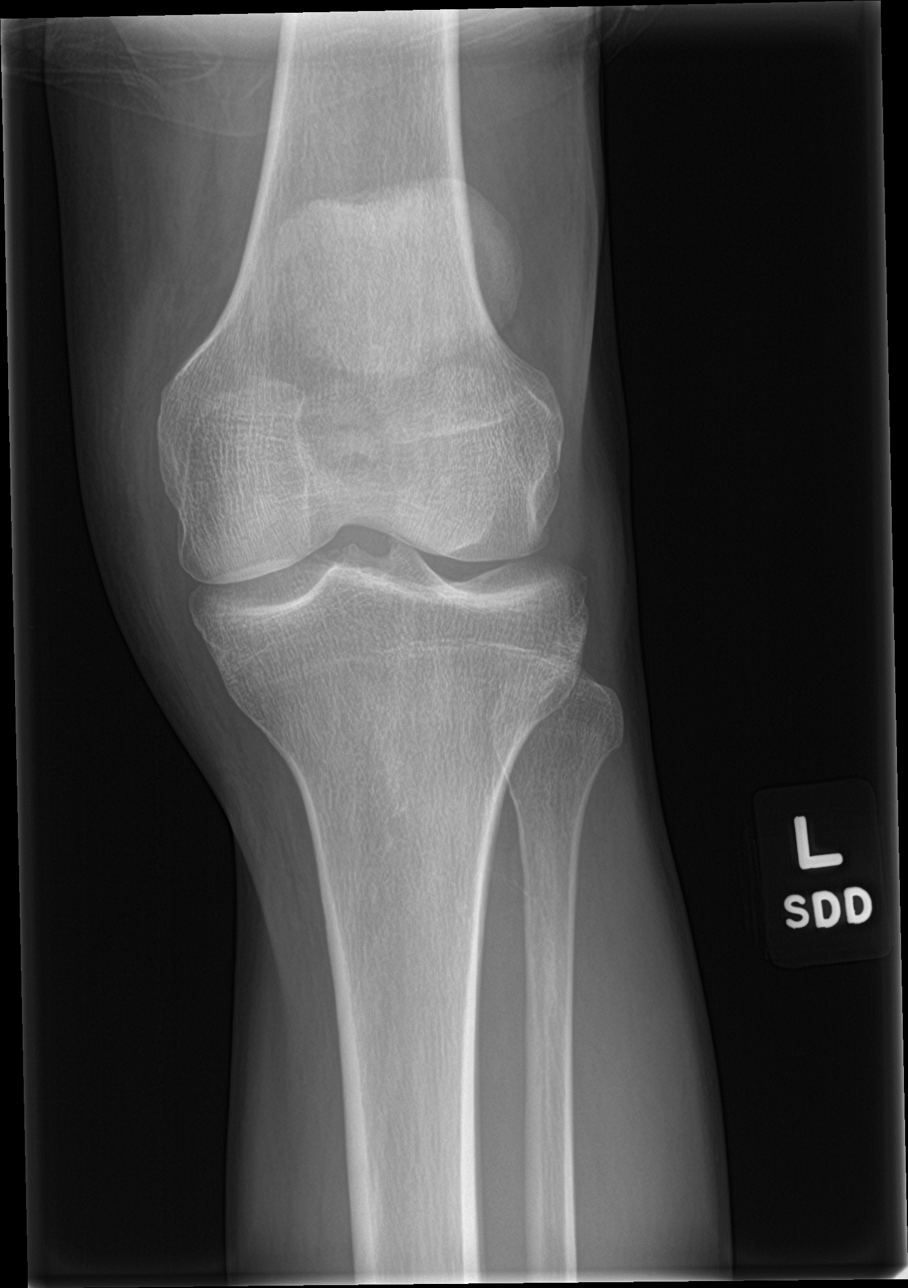

[4 of 4 positions shown; findings below may reference images not displayed]

FINDINGS: Normal anatomic alignment. No evidence for acute fracture or
dislocation. Regional soft tissues are unremarkable.
IMPRESSION: No acute osseous abnormality.

## 2023-06-17 ENCOUNTER — Encounter (HOSPITAL_COMMUNITY): Payer: Self-pay

## 2023-06-17 ENCOUNTER — Emergency Department (HOSPITAL_COMMUNITY)
Admission: EM | Admit: 2023-06-17 | Discharge: 2023-06-17 | Disposition: A | Discharge status: Home / Self Care | Attending: Emergency Medicine | Admitting: Emergency Medicine

## 2023-06-17 ENCOUNTER — Other Ambulatory Visit: Payer: Self-pay

## 2023-06-17 DIAGNOSIS — Z7982 Long term (current) use of aspirin: Secondary | ICD-10-CM | POA: Insufficient documentation

## 2023-06-17 DIAGNOSIS — Z79899 Other long term (current) drug therapy: Secondary | ICD-10-CM | POA: Diagnosis not present

## 2023-06-17 DIAGNOSIS — I10 Essential (primary) hypertension: Secondary | ICD-10-CM | POA: Diagnosis present

## 2023-06-17 DIAGNOSIS — I1A0 Resistant hypertension: Secondary | ICD-10-CM | POA: Diagnosis not present

## 2023-06-17 LAB — BASIC METABOLIC PANEL
Anion gap: 10 (ref 5–15)
BUN: 19 mg/dL (ref 8–23)
CO2: 28 mmol/L (ref 22–32)
Calcium: 9.7 mg/dL (ref 8.9–10.3)
Chloride: 100 mmol/L (ref 98–111)
Creatinine, Ser: 1.13 mg/dL (ref 0.61–1.24)
GFR, Estimated: >60 mL/min (ref >60)
Glucose, Bld: 104 mg/dL — ABNORMAL HIGH (ref 70–99)
Potassium: 3.9 mmol/L (ref 3.5–5.1)
Sodium: 138 mmol/L (ref 135–145)

## 2023-06-17 LAB — CBC WITH DIFFERENTIAL/PLATELET
Abs Immature Granulocytes: 0.01 10*3/uL (ref 0.00–0.07)
Basophils Absolute: 0.1 10*3/uL (ref 0.0–0.1)
Basophils Relative: 2 %
Eosinophils Absolute: 0.1 10*3/uL (ref 0.0–0.5)
Eosinophils Relative: 1 %
HCT: 47.5 % (ref 39.0–52.0)
Hemoglobin: 15.2 g/dL (ref 13.0–17.0)
Immature Granulocytes: 0 %
Lymphocytes Relative: 23 %
Lymphs Abs: 1.3 10*3/uL (ref 0.7–4.0)
MCH: 26.1 pg (ref 26.0–34.0)
MCHC: 32 g/dL (ref 30.0–36.0)
MCV: 81.6 fL (ref 80.0–100.0)
Monocytes Absolute: 0.6 10*3/uL (ref 0.1–1.0)
Monocytes Relative: 10 %
Neutro Abs: 3.6 10*3/uL (ref 1.7–7.7)
Neutrophils Relative %: 64 %
Platelets: 292 10*3/uL (ref 150–400)
RBC: 5.82 MIL/uL — ABNORMAL HIGH (ref 4.22–5.81)
RDW: 15.1 % (ref 11.5–15.5)
WBC: 5.6 10*3/uL (ref 4.0–10.5)
nRBC: 0 % (ref 0.0–0.2)

## 2023-06-17 LAB — URINALYSIS, ROUTINE W REFLEX MICROSCOPIC
Bilirubin Urine: NEGATIVE
Glucose, UA: NEGATIVE mg/dL
Ketones, ur: NEGATIVE mg/dL
Leukocytes,Ua: NEGATIVE
Nitrite: NEGATIVE
Protein, ur: NEGATIVE mg/dL
Specific Gravity, Urine: 1.004 — ABNORMAL LOW (ref 1.005–1.030)
pH: 8 (ref 5.0–8.0)

## 2023-06-17 MED ORDER — LISINOPRIL 30 MG PO TABS: 30.0000 mg | ORAL_TABLET | Freq: Every day | ORAL | 3 refills | Status: AC

## 2023-06-17 MED ORDER — HYDROCHLOROTHIAZIDE 25 MG PO TABS: 25.0000 mg | ORAL_TABLET | Freq: Every day | ORAL | 3 refills | Status: AC

## 2023-06-17 MED ORDER — HYDRALAZINE HCL 20 MG/ML IJ SOLN
10.0000 mg | Freq: Once | INTRAMUSCULAR | Status: AC
  Administered 2023-06-17: 10 mg via INTRAVENOUS
  Filled 2023-06-17: qty 1

## 2023-06-17 NOTE — ED Provider Notes (Signed)
 [facility] AT [facility] Provider Note   CSN: [id number] Arrival date & time: 06/17/23  1103     History  No chief complaint on file.   Jonathan Tapia is a 68 y.o. male.  He is brought in by ambulance from his doctor's office for elevated blood pressure.  He said he has been on blood pressure medication for about 10 years since a prior stroke.  He said his blood pressure has been elevated for the past 3 weeks.  He denies any headache chest pain abdominal pain nausea vomiting diarrhea shortness of breath numbness or weakness.  He said he has been taking his medications regularly does not smoke drink or do drugs.  The history is provided by the patient and the EMS personnel.  Hypertension This is a chronic problem. The problem occurs constantly. The problem has not changed since onset.Pertinent negatives include no chest pain, no abdominal pain, no headaches and no shortness of breath. Nothing aggravates the symptoms. Nothing relieves the symptoms. He has tried rest for the symptoms. The treatment provided no relief.       Home Medications Prior to Admission medications   Medication Sig Start Date End Date Taking? Authorizing Provider  aspirin EC 81 MG tablet Take 81 mg by mouth daily.      [provider]  carvedilol (COREG) 25 MG tablet Take 25 mg by mouth 2 (two) times daily with a meal.      [provider]  cyclobenzaprine (FLEXERIL) 10 MG tablet Take 1 tablet (10 mg total) by mouth 2 (two) times daily as needed for muscle spasms. 08/02/15   Arthor Captain, PA-C  diclofenac (VOLTAREN) 50 MG EC tablet Take 1 tablet (50 mg total) by mouth 2 (two) times daily. 06/28/18   Elson Areas, PA-C  Lisinopril-Hydrochlorothiazide (ZESTORETIC PO) Take 2 tablets by mouth daily.     [provider]  naproxen (NAPROSYN) 375 MG tablet Take 1 tablet (375 mg total) by mouth 2 (two) times daily. 08/02/15   Arthor Captain, PA-C  pravastatin  (PRAVACHOL) 20 MG tablet Take 20 mg by mouth at bedtime.      [provider]  tamsulosin (FLOMAX) 0.4 MG CAPS capsule Take 0.4 mg by mouth.    [provider]      Allergies    Patient has no known allergies.    Review of Systems   Review of Systems  Respiratory:  Negative for shortness of breath.   Cardiovascular:  Negative for chest pain.  Gastrointestinal:  Negative for abdominal pain.  Neurological:  Negative for headaches.    Physical Exam Updated Vital Signs BP (!) 192/88 (BP Location: Right Arm)   Pulse 61   Temp 98.3 F (36.8 C) (Oral)   Resp 18   Ht 5\' 9"  (1.753 m)   Wt 65.8 kg   SpO2 100%   BMI 21.41 kg/m  Physical Exam Vitals and nursing note reviewed.  Constitutional:      General: He is not in acute distress.    Appearance: Normal appearance. He is well-developed.  HENT:     Head: Normocephalic and atraumatic.  Eyes:     Conjunctiva/sclera: Conjunctivae normal.  Cardiovascular:     Rate and Rhythm: Normal rate and regular rhythm.     Heart sounds: No murmur heard. Pulmonary:     Effort: Pulmonary effort is normal. No respiratory distress.     Breath sounds: Normal breath sounds.  Abdominal:  Palpations: Abdomen is soft.     Tenderness: There is no abdominal tenderness. There is no guarding or rebound.  Musculoskeletal:        General: No deformity.     Cervical back: Neck supple.  Skin:    General: Skin is warm and dry.     Capillary Refill: Capillary refill takes less than 2 seconds.  Neurological:     General: No focal deficit present.     Mental Status: He is alert.     Sensory: No sensory deficit.     Motor: No weakness.     Gait: Gait normal.     ED Results / Procedures / Treatments   Labs (all labs ordered are listed, but only abnormal results are displayed) Labs Reviewed  BASIC METABOLIC PANEL - Abnormal; Notable for the following components:      Result Value   Glucose, Bld 104 (*)    All other components  within normal limits  CBC WITH DIFFERENTIAL/PLATELET - Abnormal; Notable for the following components:   RBC 5.82 (*)    All other components within normal limits  URINALYSIS, ROUTINE W REFLEX MICROSCOPIC - Abnormal; Notable for the following components:   Color, Urine COLORLESS (*)    Specific Gravity, Urine 1.004 (*)    Hgb urine dipstick SMALL (*)    Bacteria, UA RARE (*)    All other components within normal limits    EKG EKG Interpretation Date/Time:  Monday June 17 2023 11:20:47 EST Ventricular Rate:  53 PR Interval:  143 QRS Duration:  90 QT Interval:  429 QTC Calculation: 403 R Axis:   32  Text Interpretation: Sinus rhythm Left ventricular hypertrophy No old tracing to compare Confirmed by Meridee Score [phone]) on 06/17/2023 11:32:12 AM  Radiology No results found.  Procedures Procedures    Medications Ordered in ED Medications  hydrALAZINE (APRESOLINE) injection 10 mg (10 mg Intravenous Given 06/17/23 1151)    ED Course/ Medical Decision Making/ A&P Clinical Course as of 06/17/23 1703  Mon Jun 17, 2023  1337 Reviewed patient's medications.  He is on metoprolol ER 50 mg daily and lisinopril HCTZ 20/12.5.  [MB]  1352 Reviewed his information with the treatment provider at [facility] family.  They asked that we change his lisinopril HCTZ to lisinopril 30 mg and HCTZ 25 mg instead of the combo prep.  Follow-up for nurse visit blood pressure check this week. [MB]    Clinical Course User Index [MB] Terrilee Files, MD                                 Medical Decision Making Amount and/or Complexity of Data Reviewed Labs: ordered.  Risk Prescription drug management.   This patient complains of elevated blood pressure; this involves an extensive number of treatment Options and is a complaint that carries with it a high risk of complications and morbidity. The differential includes hypertension, hypertensive urgency emergency, renal insufficiency,  proteinuria  I ordered, reviewed and interpreted labs, which included CBC normal chemistries normal urinalysis without proteinuria I ordered medication IV hydralazine and reviewed PMP when indicated. Additional history obtained from patient's family members Previous records obtained and reviewed in epic I consulted patient's primary care provider and discussed lab and imaging findings and discussed disposition.  Cardiac monitoring reviewed, normal sinus rhythm Social determinants considered, no significant barriers Critical Interventions: None  After the interventions stated above, I reevaluated the patient and  found patient's blood pressure still to be elevated although asymptomatic Admission and further testing considered, no indications for admission, no evidence of endorgan damage.  Have adjusted blood pressure medicine per PCP recommendations.  They will closely follow-up with the patient.  Return instructions discussed         Final Clinical Impression(s) / ED Diagnoses Final diagnoses:  Resistant hypertension    Rx / DC Orders ED Discharge Orders          Ordered    lisinopril (ZESTRIL) 30 MG tablet  Daily        06/17/23 1354    hydrochlorothiazide (HYDRODIURIL) 25 MG tablet  Daily        06/17/23 1354              Terrilee Files, MD 06/17/23 1705

## 2023-06-17 NOTE — ED Triage Notes (Signed)
 Pt was at [facility] for bp check up according to pt and EMS. Pt's bp was elevated and facility advised pt to go to hospital. EMS was then called.

## 2023-06-17 NOTE — Discharge Instructions (Signed)
 You were sent over to the emergency department for elevation of blood pressure.  Your blood pressure was elevated here.  Your doctors want to change your lisinopril hydrochlorothiazide medication.  It will now be 2 separate medications lisinopril 30 mg and hydrochlorothiazide 25 mg.  I have sent these prescriptions to your pharmacy.  Your provider wants you to follow-up in the office this week for a nurse visit to recheck your blood pressure.  Return if any worsening or concerning symptoms
